# Patient Record
Sex: Male | Born: 1985 | Race: White | Hispanic: Yes | Smoking: Never smoker
Health system: Southern US, Community
[De-identification: ages and names within clinical notes are randomized; demographics above are authoritative.]

## PROBLEM LIST (undated history)

## (undated) HISTORY — PX: ABDOMINAL SURGERY: SHX537

## (undated) HISTORY — PX: HIP SURGERY: SHX245

---

## 2010-06-12 ENCOUNTER — Encounter: Payer: Self-pay | Admitting: Orthopaedic Surgery

## 2010-07-03 ENCOUNTER — Encounter: Payer: Self-pay | Admitting: Orthopaedic Surgery

## 2010-08-01 ENCOUNTER — Encounter: Payer: Self-pay | Admitting: Orthopaedic Surgery

## 2016-04-12 ENCOUNTER — Encounter: Payer: Self-pay | Admitting: Urgent Care

## 2016-04-12 ENCOUNTER — Emergency Department: Payer: Self-pay

## 2016-04-12 ENCOUNTER — Emergency Department
Admission: EM | Admit: 2016-04-12 | Discharge: 2016-04-12 | Disposition: A | Discharge status: Home / Self Care | Payer: Self-pay | Attending: Emergency Medicine | Admitting: Emergency Medicine

## 2016-04-12 DIAGNOSIS — K2970 Gastritis, unspecified, without bleeding: Secondary | ICD-10-CM | POA: Insufficient documentation

## 2016-04-12 LAB — URINALYSIS COMPLETE WITH MICROSCOPIC (ARMC ONLY)
BACTERIA UA: NONE SEEN
BILIRUBIN URINE: NEGATIVE
GLUCOSE, UA: NEGATIVE mg/dL
Hgb urine dipstick: NEGATIVE
Ketones, ur: NEGATIVE mg/dL
Leukocytes, UA: NEGATIVE
NITRITE: NEGATIVE
Protein, ur: 30 mg/dL — AB
SPECIFIC GRAVITY, URINE: 1.021 (ref 1.005–1.030)
pH: 7 (ref 5.0–8.0)

## 2016-04-12 LAB — CBC
HEMATOCRIT: 49.6 % (ref 40.0–52.0)
Hemoglobin: 16.6 g/dL (ref 13.0–18.0)
MCH: 29.1 pg (ref 26.0–34.0)
MCHC: 33.4 g/dL (ref 32.0–36.0)
MCV: 87 fL (ref 80.0–100.0)
PLATELETS: 265 10*3/uL (ref 150–440)
RBC: 5.71 MIL/uL (ref 4.40–5.90)
RDW: 13.6 % (ref 11.5–14.5)
WBC: 16.1 10*3/uL — ABNORMAL HIGH (ref 3.8–10.6)

## 2016-04-12 LAB — COMPREHENSIVE METABOLIC PANEL
ALBUMIN: 4.9 g/dL (ref 3.5–5.0)
ALK PHOS: 63 U/L (ref 38–126)
ALT: 80 U/L — AB (ref 17–63)
AST: 24 U/L (ref 15–41)
Anion gap: 6 (ref 5–15)
BILIRUBIN TOTAL: 0.7 mg/dL (ref 0.3–1.2)
BUN: 17 mg/dL (ref 6–20)
CO2: 29 mmol/L (ref 22–32)
CREATININE: 1 mg/dL (ref 0.61–1.24)
Calcium: 9.8 mg/dL (ref 8.9–10.3)
Chloride: 104 mmol/L (ref 101–111)
GFR calc Af Amer: >60 mL/min (ref >60)
GFR calc non Af Amer: >60 mL/min (ref >60)
GLUCOSE: 117 mg/dL — AB (ref 65–99)
POTASSIUM: 3.7 mmol/L (ref 3.5–5.1)
Sodium: 139 mmol/L (ref 135–145)
TOTAL PROTEIN: 8.3 g/dL — AB (ref 6.5–8.1)

## 2016-04-12 LAB — LIPASE, BLOOD: Lipase: 23 U/L (ref 11–51)

## 2016-04-12 MED ORDER — MORPHINE SULFATE (PF) 4 MG/ML IV SOLN
INTRAVENOUS | Status: AC
  Administered 2016-04-12: 4 mg via INTRAVENOUS
  Filled 2016-04-12: qty 1

## 2016-04-12 MED ORDER — MORPHINE SULFATE (PF) 4 MG/ML IV SOLN
4.0000 mg | Freq: Once | INTRAVENOUS | Status: AC
  Administered 2016-04-12: 4 mg via INTRAVENOUS

## 2016-04-12 MED ORDER — ONDANSETRON HCL 4 MG/2ML IJ SOLN
4.0000 mg | INTRAMUSCULAR | Status: AC
  Administered 2016-04-12: 4 mg via INTRAVENOUS
  Filled 2016-04-12: qty 2

## 2016-04-12 MED ORDER — GI COCKTAIL ~~LOC~~
ORAL | Status: AC
  Administered 2016-04-12: 30 mL via ORAL
  Filled 2016-04-12: qty 30

## 2016-04-12 MED ORDER — RANITIDINE HCL 150 MG PO TABS: 150.0000 mg | ORAL_TABLET | Freq: Two times a day (BID) | ORAL | 1 refills | Status: AC

## 2016-04-12 MED ORDER — IOPAMIDOL (ISOVUE-300) INJECTION 61%
30.0000 mL | Freq: Once | INTRAVENOUS | Status: AC | PRN
  Administered 2016-04-12: 30 mL via ORAL

## 2016-04-12 MED ORDER — IOPAMIDOL (ISOVUE-300) INJECTION 61%
100.0000 mL | Freq: Once | INTRAVENOUS | Status: AC | PRN
  Administered 2016-04-12: 100 mL via INTRAVENOUS

## 2016-04-12 MED ORDER — SUCRALFATE 1 G PO TABS: 1.0000 g | ORAL_TABLET | Freq: Four times a day (QID) | ORAL | 0 refills | Status: AC

## 2016-04-12 MED ORDER — MORPHINE SULFATE (PF) 4 MG/ML IV SOLN
4.0000 mg | Freq: Once | INTRAVENOUS | Status: AC
  Administered 2016-04-12: 4 mg via INTRAVENOUS
  Filled 2016-04-12: qty 1

## 2016-04-12 MED ORDER — OXYCODONE-ACETAMINOPHEN 5-325 MG PO TABS
1.0000 | ORAL_TABLET | Freq: Once | ORAL | Status: AC
  Administered 2016-04-12: 1 via ORAL

## 2016-04-12 MED ORDER — OXYCODONE-ACETAMINOPHEN 5-325 MG PO TABS
ORAL_TABLET | ORAL | Status: AC
  Administered 2016-04-12: 1 via ORAL
  Filled 2016-04-12: qty 1

## 2016-04-12 MED ORDER — GI COCKTAIL ~~LOC~~
30.0000 mL | Freq: Once | ORAL | Status: AC
  Administered 2016-04-12: 30 mL via ORAL

## 2016-04-12 NOTE — Discharge Instructions (Signed)
Please seek medical attention for any high fevers, chest pain, shortness of breath, change in behavior, persistent vomiting, bloody stool or any other new or concerning symptoms.  
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
## 2016-04-12 NOTE — ED Triage Notes (Signed)
Patient presents with c/o epigastric pain that began during this night. (+) N/V reported.

## 2016-11-13 IMAGING — US US ABDOMEN LIMITED
1 series · 14 of 25 positions shown · non-contrast
Comparison: None.

CLINICAL DATA: Epigastric abdominal pain, nausea and vomiting.

EXAM:
US ABDOMEN LIMITED - RIGHT UPPER QUADRANT

[Series 1: us abdomen limited · 0.22mm/px · 14 of 50 slices shown]
[im 1/50]
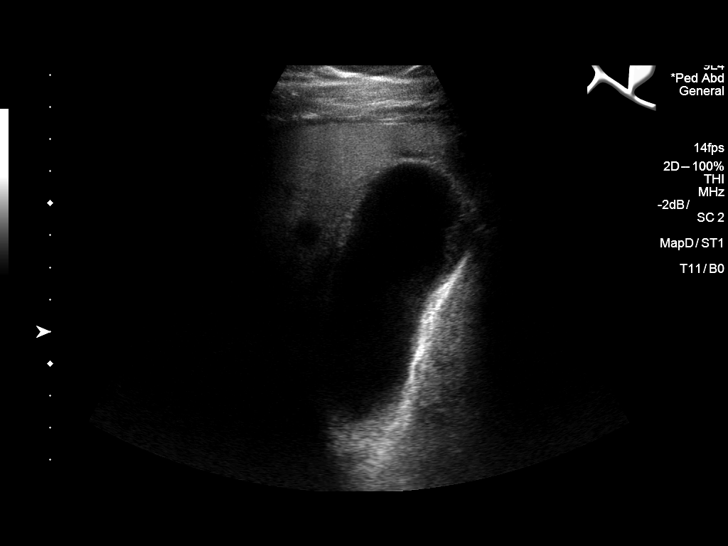
[im 5/50]
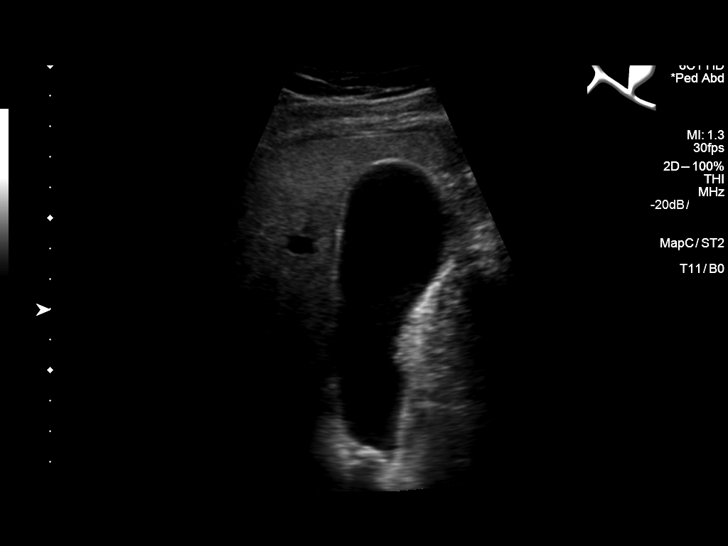
[im 9/50]
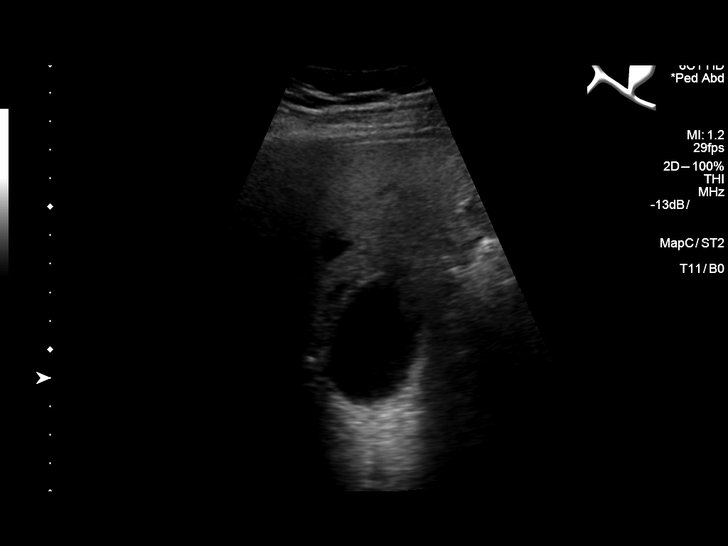
[im 13/50]
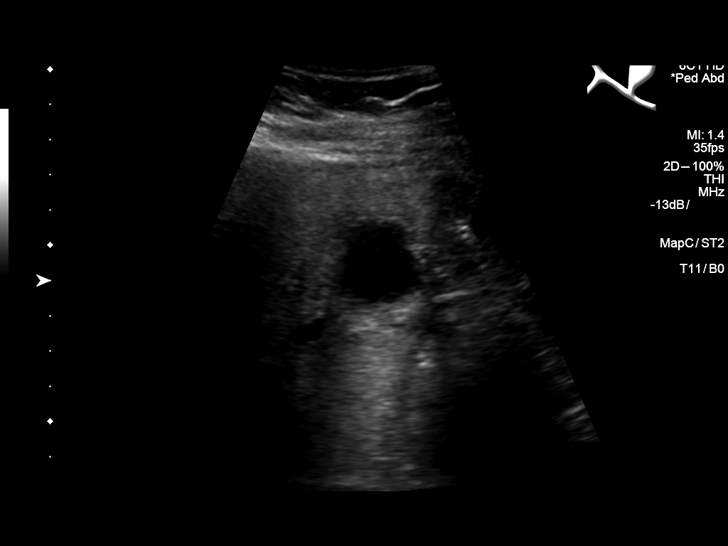
[im 17/50]
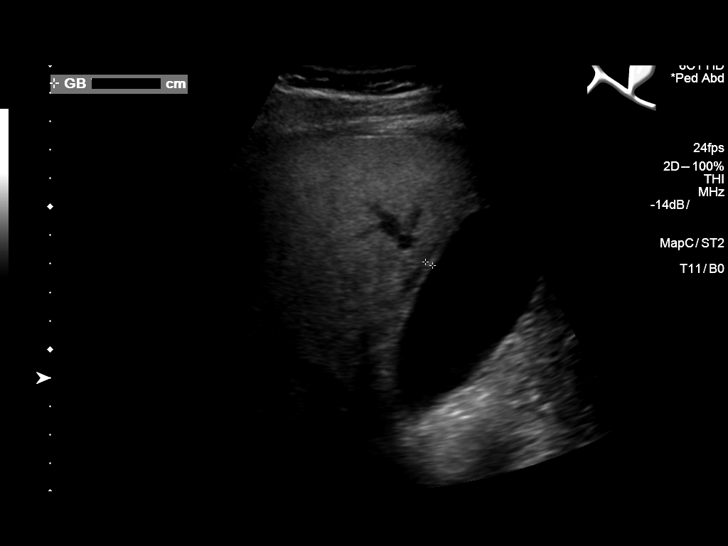
[im 19/50]
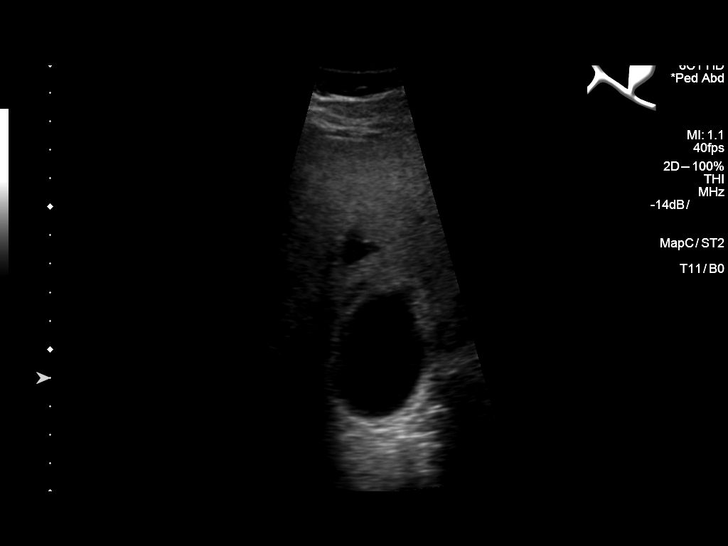
[im 23/50]
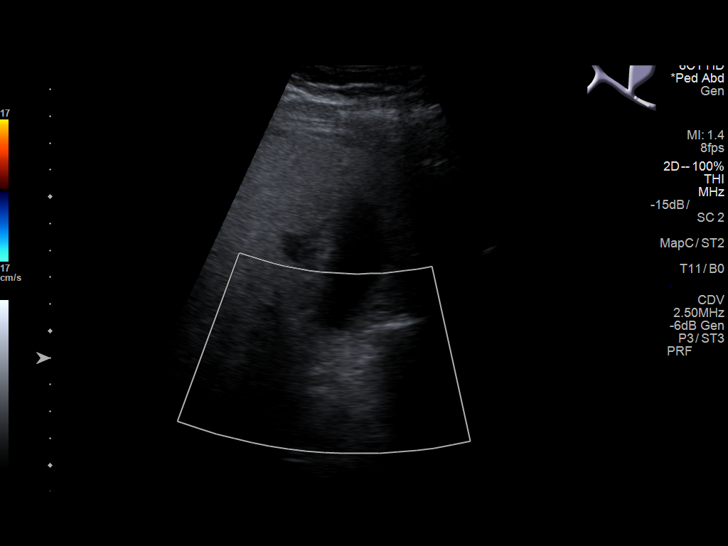
[im 27/50]
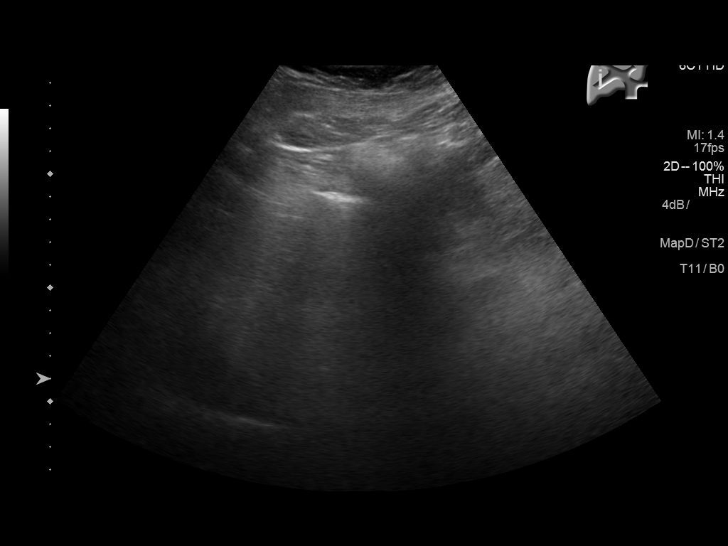
[im 31/50]
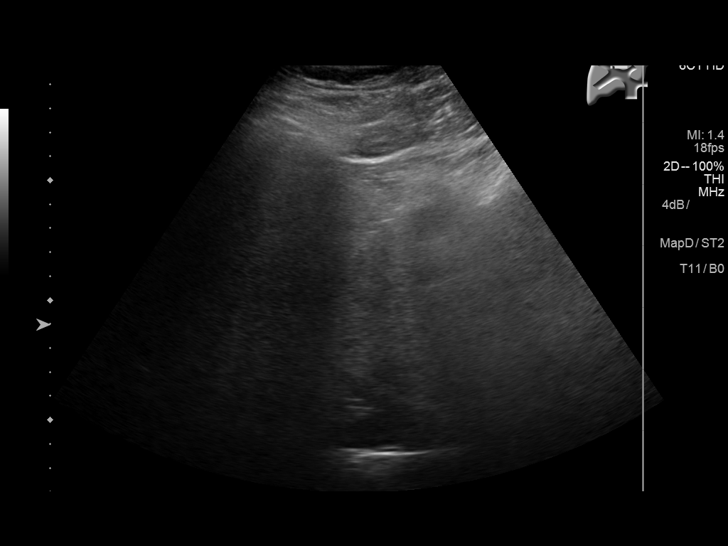
[im 33/50]
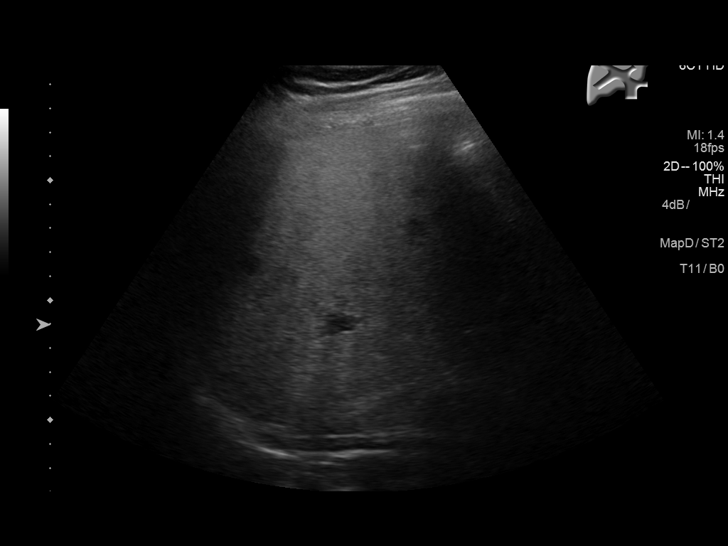
[im 37/50]
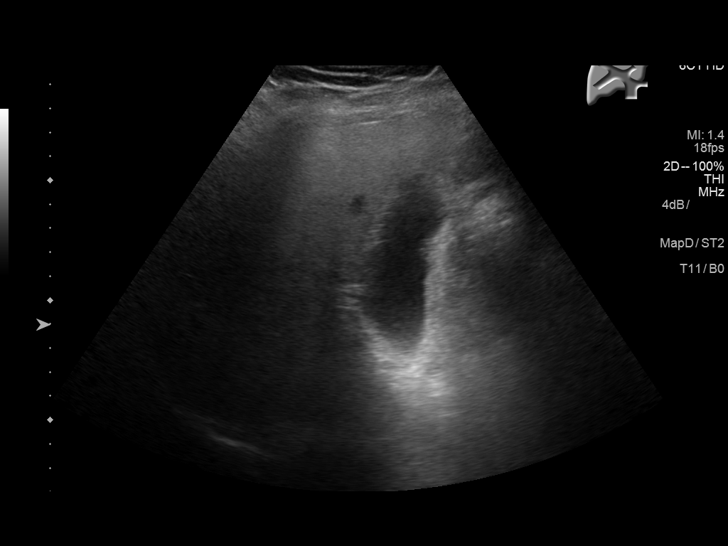
[im 41/50]
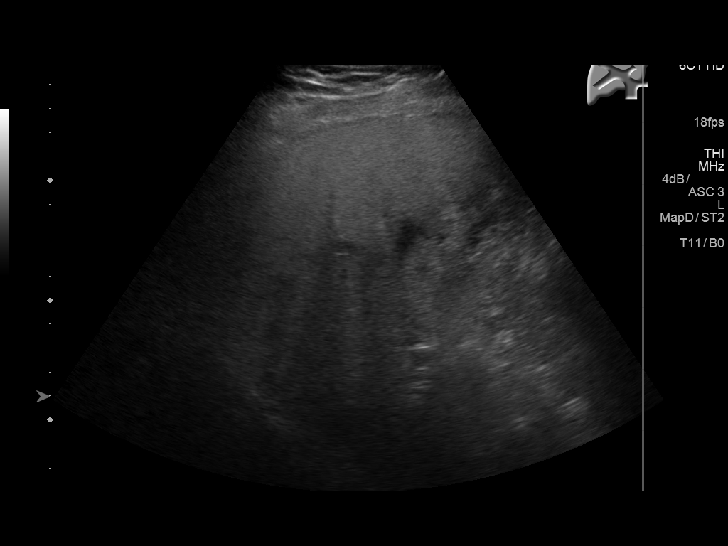
[im 45/50]
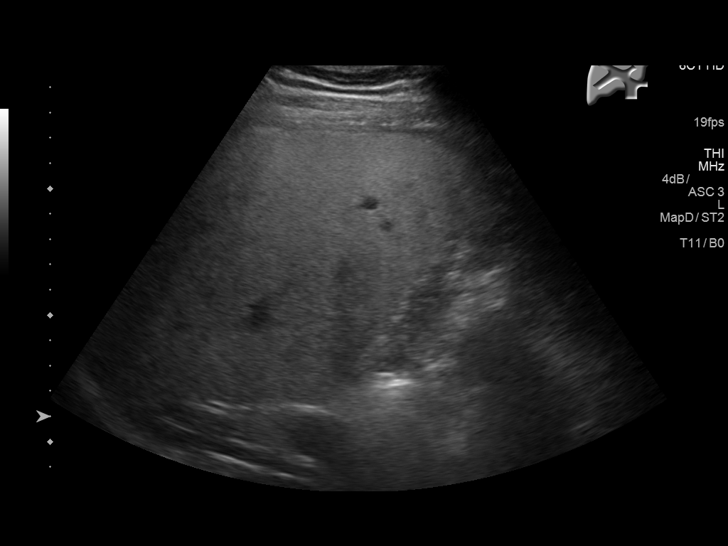
[im 50/50]
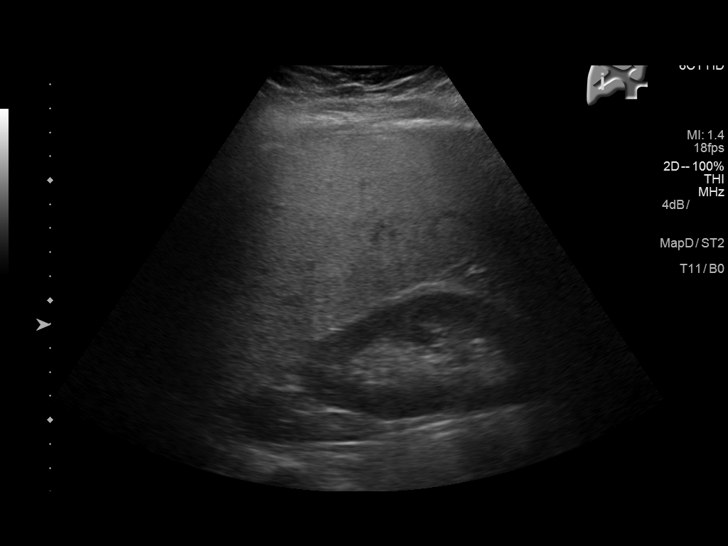

[14 of 25 positions shown; findings below may reference images not displayed]

FINDINGS: Gallbladder:

No gallstones or wall thickening visualized. No sonographic Murphy
sign noted by sonographer.

Common bile duct:

Diameter: 5 mm

Liver:

Liver parenchyma is diffusely moderately to markedly echogenic with
posterior acoustic attenuation, consistent with moderate to severe
diffuse hepatic steatosis. No definite liver surface irregularity.
No liver mass detected, noting decreased sensitivity in the setting
of an echogenic liver. No perihepatic ascites.
IMPRESSION: 1. Moderate to severe diffuse hepatic steatosis.
2. Normal gallbladder with no cholelithiasis.
3. No biliary ductal dilatation.

## 2017-10-05 IMAGING — CT CT ABD-PELV W/ CM
2 of 5 series · 15 of 46 positions shown, 17 images · IV contrast (iopamidol)
Comparison: Right upper quadrant ultrasound earlier today.

CLINICAL DATA: Acute onset of severe epigastric abdominal pain with
nausea and vomiting.

EXAM:
CT ABDOMEN AND PELVIS WITH CONTRAST
TECHNIQUE: Multidetector CT imaging of the abdomen and pelvis was performed
using the standard protocol following bolus administration of
intravenous contrast.
CONTRAST:  100mL HESW3P-FDD IOPAMIDOL (HESW3P-FDD) INJECTION 61%

[Series 2: axial st · axial · 0.81mm/px · z∈[-891,-441]mm · 12 of 104 slices shown, 14 images]
[im 7/104  soft-tissue]
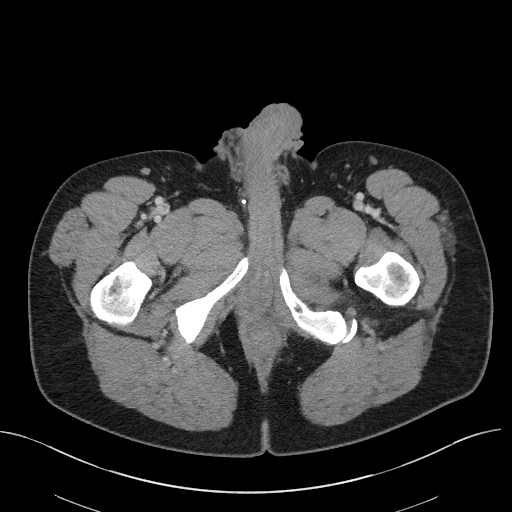
[im 7/104  bone]
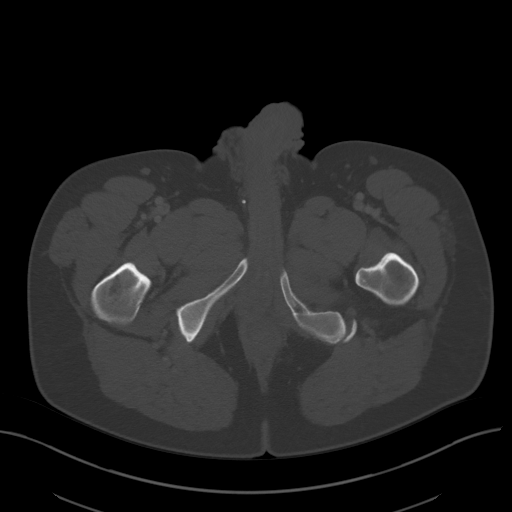
[im 19/104  soft-tissue]
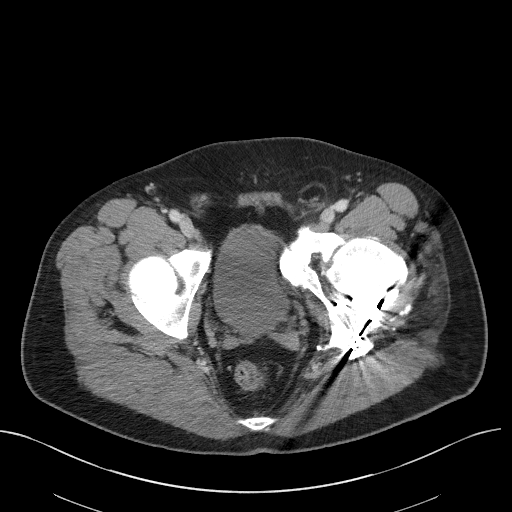
[im 25/104  soft-tissue]
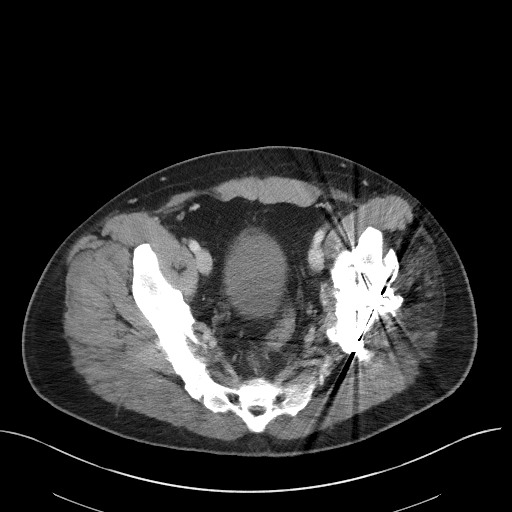
[im 31/104  soft-tissue]
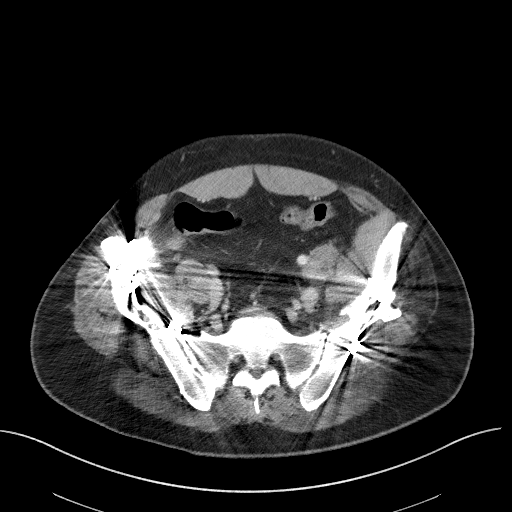
[im 43/104  soft-tissue]
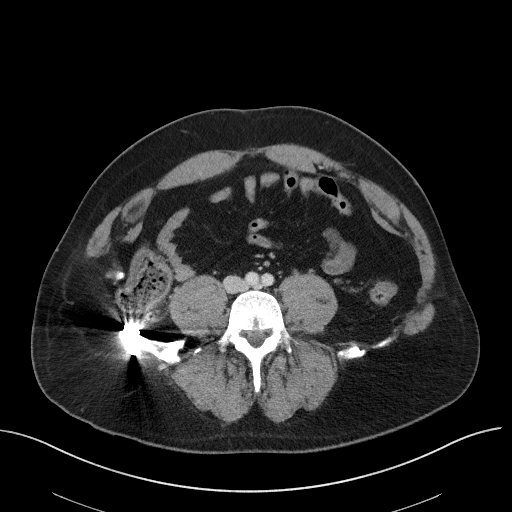
[im 49/104  soft-tissue]
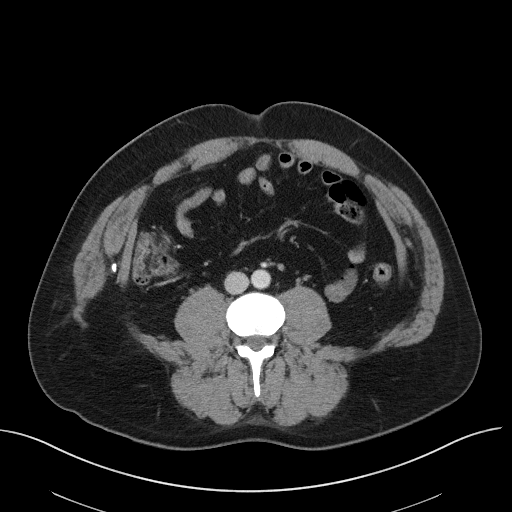
[im 55/104  soft-tissue]
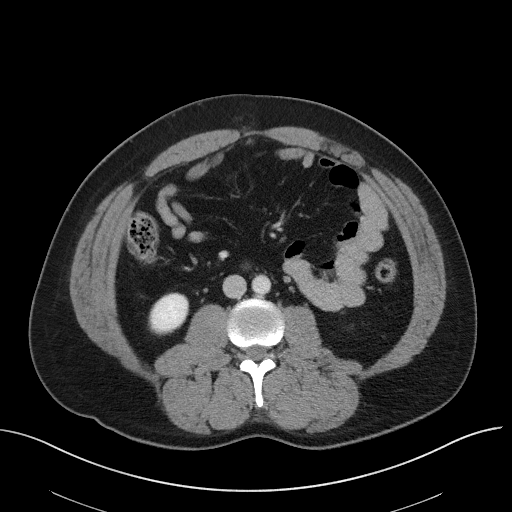
[im 67/104  soft-tissue]
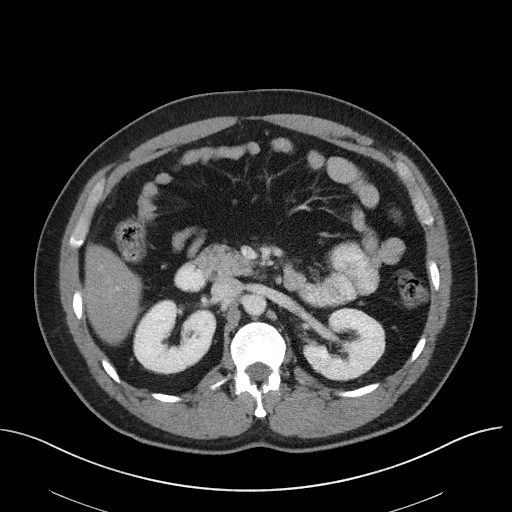
[im 73/104  soft-tissue]
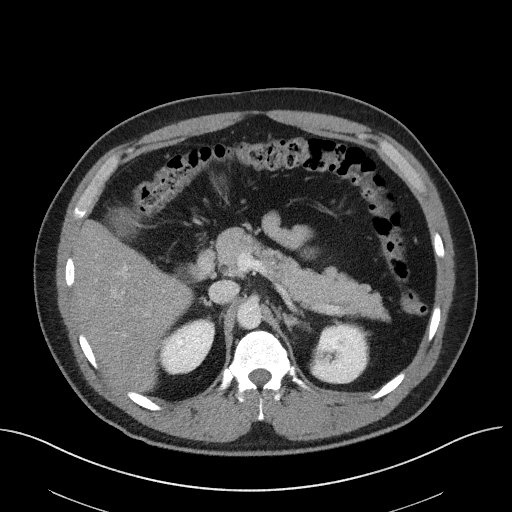
[im 73/104  bone]
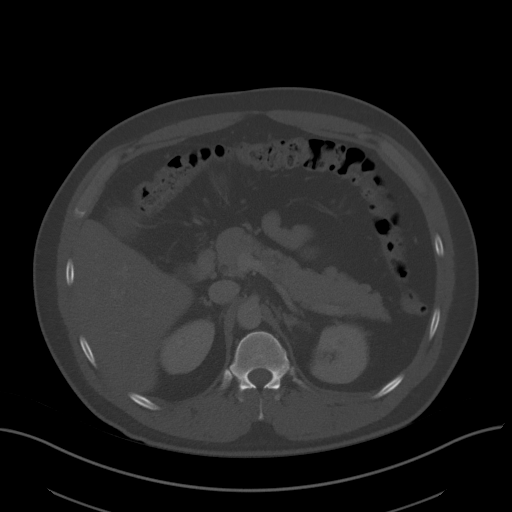
[im 79/104  soft-tissue]
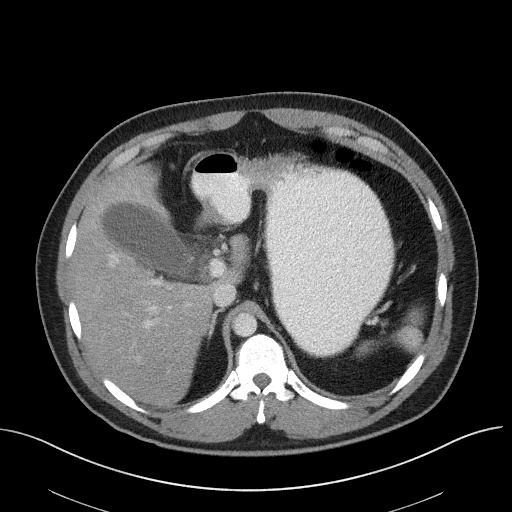
[im 91/104  soft-tissue]
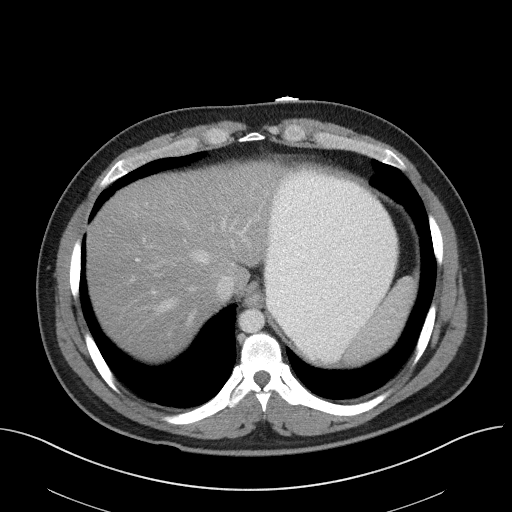
[im 97/104  soft-tissue]
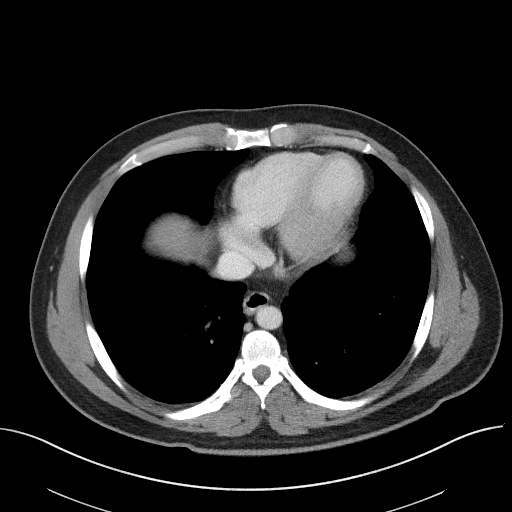

[Series 5: coronal st · coronal · 0.70mm/px · 3 of 96 slices shown]
[im 32/96  soft-tissue]
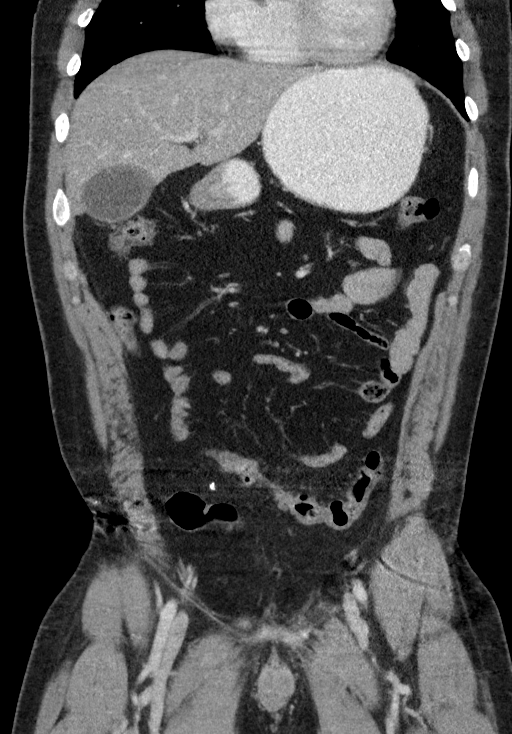
[im 43/96  soft-tissue]
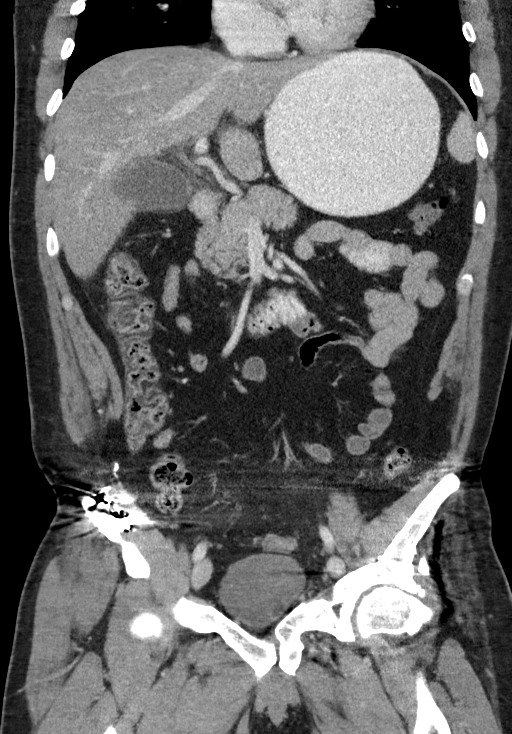
[im 53/96  soft-tissue]
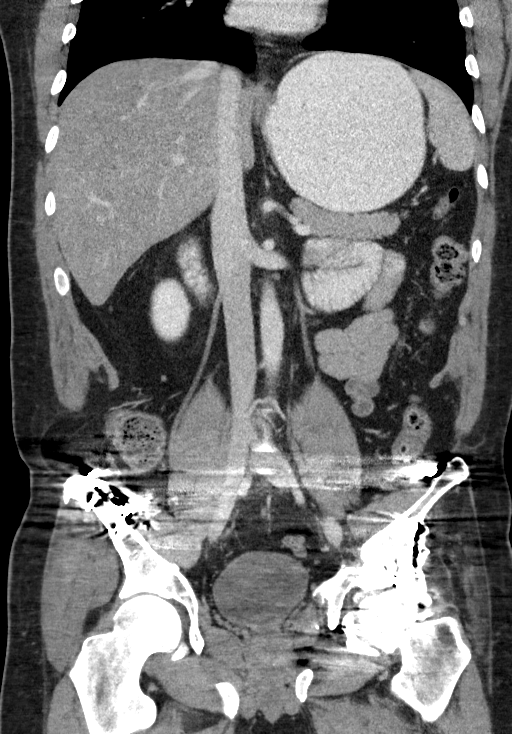

[15 of 46 positions shown; findings below may reference images not displayed]

FINDINGS: Lower chest: No acute abnormality.

Hepatobiliary: Diffuse hepatic steatosis without overt cirrhosis. No
focal masses or biliary dilatation. There is some inflammation and
stranding in the fat medial and inferior to the gallbladder. Given
unremarkable appearance of the gallbladder by ultrasound, the
etiology may be more likely due to inflammation of the duodenum
rather than the gallbladder. No evidence of biliary ductal
dilatation.

Pancreas: Unremarkable. No pancreatic ductal dilatation or
surrounding inflammatory changes.

Spleen: Normal in size without focal abnormality.

Adrenals/Urinary Tract: Adrenal glands are unremarkable. Kidneys are
normal, without renal calculi, focal lesion, or hydronephrosis.
Bladder is unremarkable.

Stomach/Bowel: The stomach is moderately distended with a just oral
contrast. Relative narrowing of the distal body of the stomach
likely is secondary to peristalsis. There is relative thickened
appearance of the duodenal bulb and proximal duodenum with
inflammatory changes in the adjacent fat just superior to the
duodenum and extending towards the gallbladder and porta hepatis. No
overt ulceration or perforation is identified. Findings may relate
to peptic ulcer disease.

Other bowel shows normal appearance by CT including the appendix. No
evidence of free air or abnormal fluid collections.

Vascular/Lymphatic: No enlarged lymph nodes are seen. The abdominal
aorta is normal in caliber. No additional arterial or venous
abnormalities identified.

Other: Tiny left inguinal hernia contains fat.  No ascites.

Musculoskeletal: Evidence of prior extensive pelvic surgery with
reconstructive hardware noted in both right and left sides of the
pelvis as well as the sacroiliac joints bilaterally. Healed
deformities noted related to prior left pubic fractures.
Proliferative disease and degenerative disease of the left hip joint
present. No acute fracture or bony lesions identified.
IMPRESSION: 1. Diffuse hepatic steatosis without evidence of overt cirrhosis.
2. Inflammatory process likely emanating from the proximal duodenum
and suggestive of peptic ulcer disease without overt evidence by CT
of a perforated ulcer. Inflammatory changes to abut the medial
aspect of the gallbladder. However, the gallbladder appeared
unremarkable by ultrasound.

## 2018-12-13 ENCOUNTER — Ambulatory Visit: Payer: Self-pay
